# Patient Record
Sex: Male | Born: 1985 | Race: Black or African American | Hispanic: No | Marital: Married | Smoking: Current every day smoker
Health system: Southern US, Community
[De-identification: ages and names within clinical notes are randomized; demographics above are authoritative.]

---
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
## 2008-12-21 ENCOUNTER — Emergency Department (HOSPITAL_COMMUNITY): Admission: EM | Admit: 2008-12-21 | Discharge: 2008-12-22 | Payer: Self-pay | Admitting: Emergency Medicine

## 2009-05-13 ENCOUNTER — Emergency Department (HOSPITAL_COMMUNITY): Admission: EM | Admit: 2009-05-13 | Discharge: 2009-05-13 | Payer: Self-pay | Admitting: Emergency Medicine

## 2010-10-17 ENCOUNTER — Emergency Department (HOSPITAL_COMMUNITY)
Admission: EM | Admit: 2010-10-17 | Discharge: 2010-10-17 | Discharge status: Against Medical Advice | Payer: No Typology Code available for payment source | Attending: Emergency Medicine | Admitting: Emergency Medicine

## 2010-10-17 ENCOUNTER — Emergency Department (HOSPITAL_COMMUNITY): Payer: Self-pay

## 2010-10-17 DIAGNOSIS — M542 Cervicalgia: Secondary | ICD-10-CM | POA: Insufficient documentation

## 2010-10-18 ENCOUNTER — Inpatient Hospital Stay (INDEPENDENT_AMBULATORY_CARE_PROVIDER_SITE_OTHER)
Admission: RE | Admit: 2010-10-18 | Discharge: 2010-10-18 | Disposition: A | Discharge status: Home / Self Care | Payer: Self-pay | Source: Ambulatory Visit | Attending: Family Medicine | Admitting: Family Medicine

## 2010-10-18 DIAGNOSIS — Z0489 Encounter for examination and observation for other specified reasons: Secondary | ICD-10-CM

## 2010-11-06 LAB — POCT I-STAT, CHEM 8
BUN: 13 mg/dL (ref 6–23)
Calcium, Ion: 1.23 mmol/L (ref 1.12–1.32)
Chloride: 106 meq/L (ref 96–112)
Creatinine, Ser: 1 mg/dL (ref 0.4–1.5)
Glucose, Bld: 118 mg/dL — ABNORMAL HIGH (ref 70–99)
HCT: 44 % (ref 39.0–52.0)
Hemoglobin: 15 g/dL (ref 13.0–17.0)
Potassium: 3.6 meq/L (ref 3.5–5.1)
Sodium: 142 meq/L (ref 135–145)
TCO2: 26 mmol/L (ref 0–100)

## 2010-11-06 LAB — CBC
HCT: 40.6 % (ref 39.0–52.0)
Hemoglobin: 13.2 g/dL (ref 13.0–17.0)
MCHC: 32.5 g/dL (ref 30.0–36.0)
MCV: 78.8 fL (ref 78.0–100.0)
Platelets: 210 10*3/uL (ref 150–400)
RBC: 5.15 MIL/uL (ref 4.22–5.81)
RDW: 17.5 % — ABNORMAL HIGH (ref 11.5–15.5)
WBC: 7.4 10*3/uL (ref 4.0–10.5)

## 2010-11-06 LAB — DIFFERENTIAL
Basophils Absolute: 0 K/uL (ref 0.0–0.1)
Basophils Relative: 0 % (ref 0–1)
Eosinophils Absolute: 0.4 K/uL (ref 0.0–0.7)
Eosinophils Relative: 6 % — ABNORMAL HIGH (ref 0–5)
Lymphocytes Relative: 20 % (ref 12–46)
Lymphs Abs: 1.5 K/uL (ref 0.7–4.0)
Monocytes Absolute: 0.3 K/uL (ref 0.1–1.0)
Monocytes Relative: 4 % (ref 3–12)
Neutro Abs: 5.1 K/uL (ref 1.7–7.7)
Neutrophils Relative %: 69 % (ref 43–77)

## 2014-08-27 ENCOUNTER — Encounter (HOSPITAL_COMMUNITY): Payer: Self-pay | Admitting: Emergency Medicine

## 2014-08-27 ENCOUNTER — Emergency Department (HOSPITAL_COMMUNITY)
Admission: EM | Admit: 2014-08-27 | Discharge: 2014-08-27 | Disposition: A | Discharge status: Home / Self Care | Payer: Self-pay | Attending: Emergency Medicine | Admitting: Emergency Medicine

## 2014-08-27 ENCOUNTER — Emergency Department (HOSPITAL_COMMUNITY): Payer: Self-pay

## 2014-08-27 DIAGNOSIS — W228XXA Striking against or struck by other objects, initial encounter: Secondary | ICD-10-CM | POA: Insufficient documentation

## 2014-08-27 DIAGNOSIS — Z720 Tobacco use: Secondary | ICD-10-CM | POA: Insufficient documentation

## 2014-08-27 DIAGNOSIS — Y998 Other external cause status: Secondary | ICD-10-CM | POA: Insufficient documentation

## 2014-08-27 DIAGNOSIS — Y9389 Activity, other specified: Secondary | ICD-10-CM | POA: Insufficient documentation

## 2014-08-27 DIAGNOSIS — Y9289 Other specified places as the place of occurrence of the external cause: Secondary | ICD-10-CM | POA: Insufficient documentation

## 2014-08-27 DIAGNOSIS — S60221A Contusion of right hand, initial encounter: Secondary | ICD-10-CM | POA: Insufficient documentation

## 2014-08-27 NOTE — ED Notes (Addendum)
Pt reports punching a wall last night and injuring his right wrist yesterday. Hand is noticeably swollen. No obvious deformities. Took Ibuprofen this morning with no alleviation of symptoms. No other c/c. No open lacerations or abrasions.

## 2014-08-27 NOTE — Discharge Instructions (Signed)
Contusion °A contusion is a deep bruise. Contusions are the result of an injury that caused bleeding under the skin. The contusion may turn blue, purple, or yellow. Minor injuries will give you a painless contusion, but more severe contusions may stay painful and swollen for a few weeks.  °CAUSES  °A contusion is usually caused by a blow, trauma, or direct force to an area of the body. °SYMPTOMS  °· Swelling and redness of the injured area. °· Bruising of the injured area. °· Tenderness and soreness of the injured area. °· Pain. °DIAGNOSIS  °The diagnosis can be made by taking a history and physical exam. An X-ray, CT scan, or MRI may be needed to determine if there were any associated injuries, such as fractures. °TREATMENT  °Specific treatment will depend on what area of the body was injured. In general, the best treatment for a contusion is resting, icing, elevating, and applying cold compresses to the injured area. Over-the-counter medicines may also be recommended for pain control. Ask your caregiver what the best treatment is for your contusion. °HOME CARE INSTRUCTIONS  °· Put ice on the injured area. °¨ Put ice in a plastic bag. °¨ Place a towel between your skin and the bag. °¨ Leave the ice on for 15-20 minutes, 3-4 times a day, or as directed by your health care provider. °· Only take over-the-counter or prescription medicines for pain, discomfort, or fever as directed by your caregiver. Your caregiver may recommend avoiding anti-inflammatory medicines (aspirin, ibuprofen, and naproxen) for 48 hours because these medicines may increase bruising. °· Rest the injured area. °· If possible, elevate the injured area to reduce swelling. °SEEK IMMEDIATE MEDICAL CARE IF:  °· You have increased bruising or swelling. °· You have pain that is getting worse. °· Your swelling or pain is not relieved with medicines. °MAKE SURE YOU:  °· Understand these instructions. °· Will watch your condition. °· Will get help right  away if you are not doing well or get worse. °Document Released: 04/24/2005 Document Revised: 07/20/2013 Document Reviewed: 05/20/2011 °[employer]® Patient Information ©2015 [employer]. This information is not intended to replace advice given to you by your health care provider. Make sure you discuss any questions you have with your health care provider. ° °

## 2014-08-27 NOTE — ED Provider Notes (Signed)
CSN: [id number]     Arrival date & time 08/27/14  1501 History  This chart was scribed for non-physician practitioner, Langston MaskerKaren Ilisa Hayworth, PA-C,working with Toy BakerAnthony T Allen, MD, by Karle PlumberJennifer Tensley, ED Scribe. This patient was seen in room WTR8/WTR8 and the patient's care was started at 4:37 PM.  Chief Complaint  Patient presents with  . Wrist Injury   Patient is a 29 y.o. male presenting with wrist injury. The history is provided by the patient and medical records. No language interpreter was used.  Wrist Injury Associated symptoms: no fever     HPI Comments:  Nicholas Walker is a 29 y.o. male who presents to the Emergency Department complaining of severe right wrist pain that began last night after punching a wall. He reports associated swelling of the hand as well. He states he took some Ibuprofen earlier this morning with no significant relief of the pain. Moving or touching the area makes the pain worse. Denies alleviating factors. Denies bruising, wounds, fever, chills, nausea or vomiting.  History reviewed. No pertinent past medical history. History reviewed. No pertinent past surgical history. History reviewed. No pertinent family history. History  Substance Use Topics  . Smoking status: Current Every Day Smoker  . Smokeless tobacco: Not on file  . Alcohol Use: Yes    Review of Systems  Constitutional: Negative for fever and chills.  Gastrointestinal: Negative for nausea and vomiting.  Musculoskeletal: Positive for joint swelling and arthralgias.  Skin: Negative for color change and wound.  All other systems reviewed and are negative.     Allergies  Review of patient's allergies indicates no known allergies.  Home Medications   Prior to Admission medications   Medication Sig Start Date End Date Taking? Authorizing Provider  ibuprofen (ADVIL,MOTRIN) 200 MG tablet Take 400 mg by mouth every 6 (six) hours as needed for moderate pain (wrist pain).   Yes Historical Provider, MD    Triage Vitals: BP 125/63 mmHg  Pulse 70  Temp(Src) 97.3 F (36.3 C) (Oral)  Resp 17  SpO2 99% Physical Exam  Constitutional: He is oriented to person, place, and time. He appears well-developed and well-nourished.  HENT:  Head: Normocephalic and atraumatic.  Eyes: EOM are normal.  Neck: Normal range of motion.  Cardiovascular: Normal rate.   Pulmonary/Chest: Effort normal.  Musculoskeletal: Normal range of motion. He exhibits tenderness. He exhibits no edema.  Diffusely swollen right hand. Diffusely tender to 2nd, 3rd and 4th metacarpal heads. No deformity.  Neurological: He is alert and oriented to person, place, and time.  Skin: Skin is warm and dry.  Psychiatric: He has a normal mood and affect. His behavior is normal.  Nursing note and vitals reviewed.   ED Course  Procedures (including critical care time) DIAGNOSTIC STUDIES: Oxygen Saturation is 99% on RA, normal by my interpretation.   COORDINATION OF CARE: 4:40 PM- Informed pt that X-Ray was negative and offered pain medication but pt declined. Pt verbalizes understanding and agrees to plan.  Medications - No data to display  Labs Review Labs Reviewed - No data to display  Imaging Review Dg Wrist Complete Right  08/27/2014   CLINICAL DATA:  Pain after punching wall 1 day prior  EXAM: RIGHT WRIST - COMPLETE 3+ VIEW  COMPARISON:  None.  FINDINGS: Frontal, oblique, lateral, and ulnar deviation scaphoid images were obtained. No fracture or dislocation. Joint spaces appear intact. No erosive change.  IMPRESSION: No fracture or dislocation.  No appreciable arthropathy.   Electronically Signed  By: Bretta Bang M.D.   On: 08/27/2014 16:28   Dg Hand Complete Right  08/27/2014   CLINICAL DATA:  The patient punched a wall 08/26/2014. Left hand and wrist pain. Initial encounter.  EXAM: RIGHT HAND - COMPLETE 3+ VIEW  COMPARISON:  None.  FINDINGS: Imaged bones, joints and soft tissues appear normal.  IMPRESSION: Negative  exam.   Electronically Signed   By: Drusilla Kanner M.D.   On: 08/27/2014 16:27     EKG Interpretation None      MDM   Final diagnoses:  Contusion, hand, right, initial encounter      I personally performed the services described in this documentation, which was scribed in my presence. The recorded information has been reviewed and is accurate.    Lonia Skinner [place], PA-C 08/27/14 2019  Toy Baker, MD 08/28/14 [phone]

## 2015-11-22 IMAGING — CR DG WRIST COMPLETE 3+V*R*
4 series · 4 of 4 positions shown · non-contrast
Comparison: None.

CLINICAL DATA: Pain after punching wall 1 day prior

EXAM:
RIGHT WRIST - COMPLETE 3+ VIEW

[x wrist pa right]
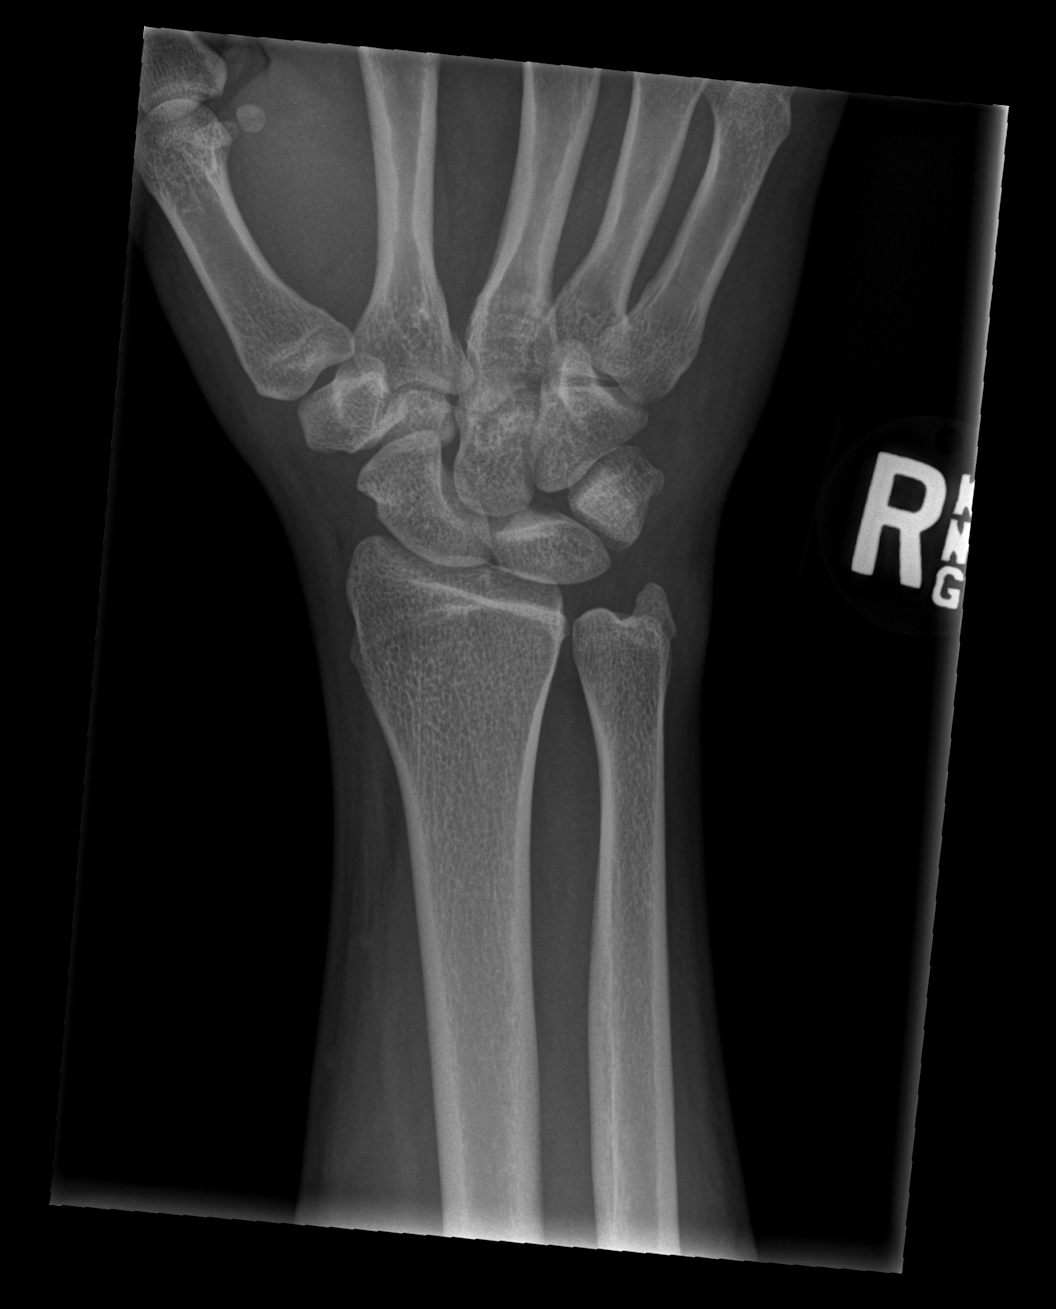

[x wrist obl right]
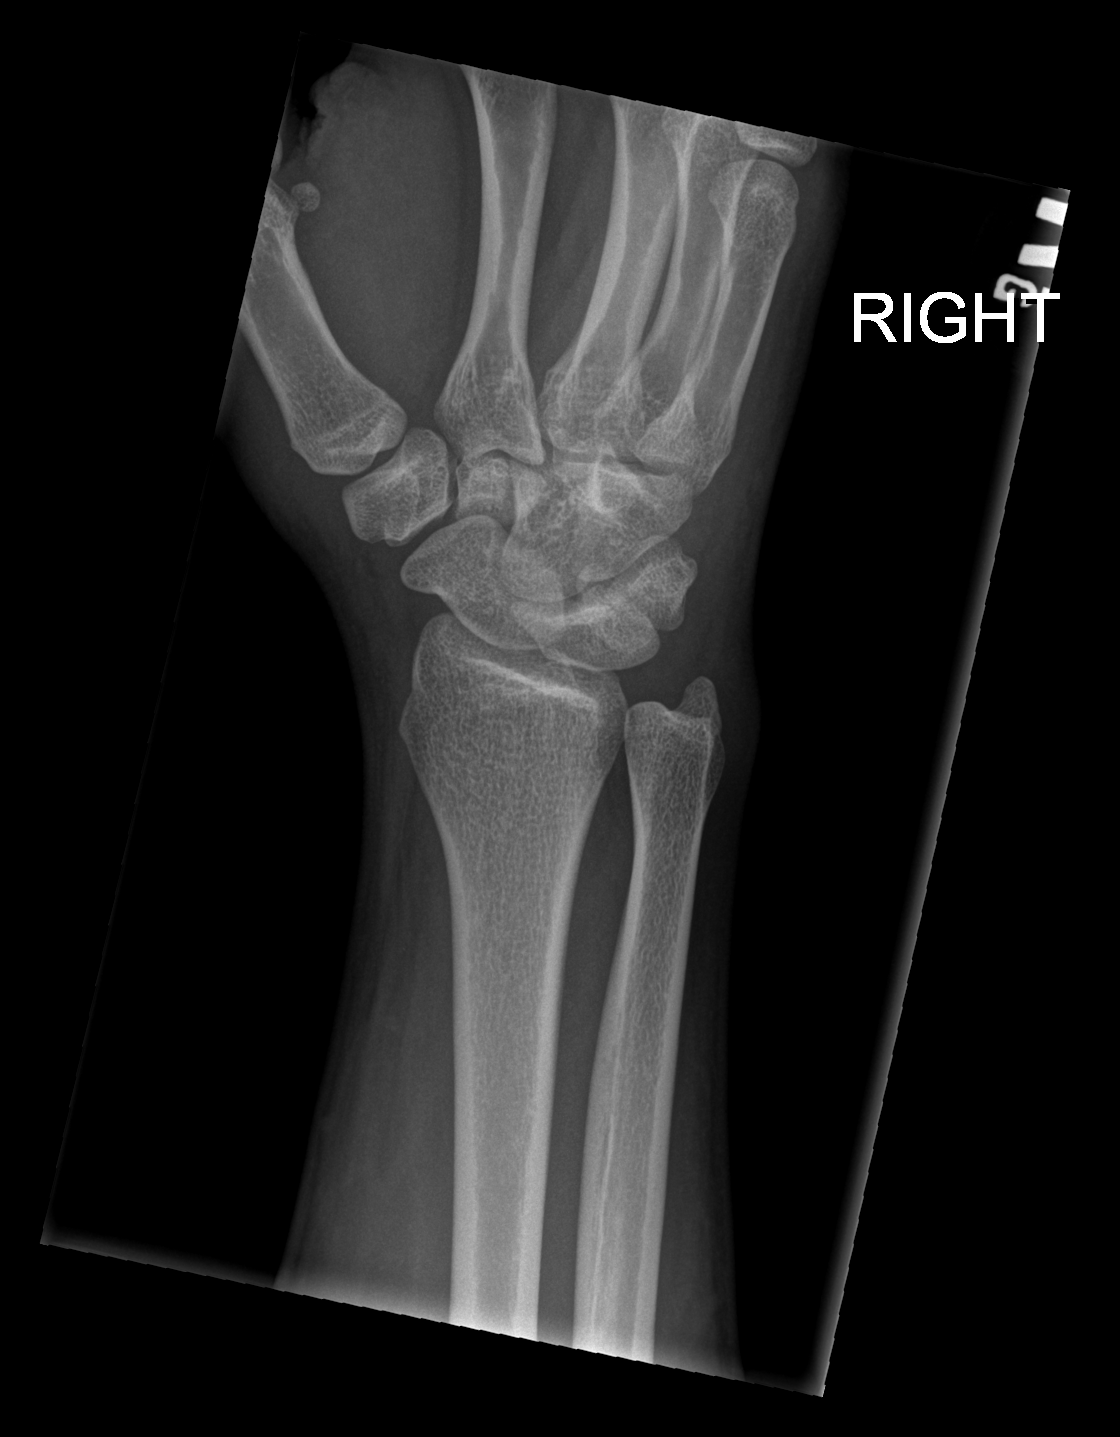

[x wrist lat right]
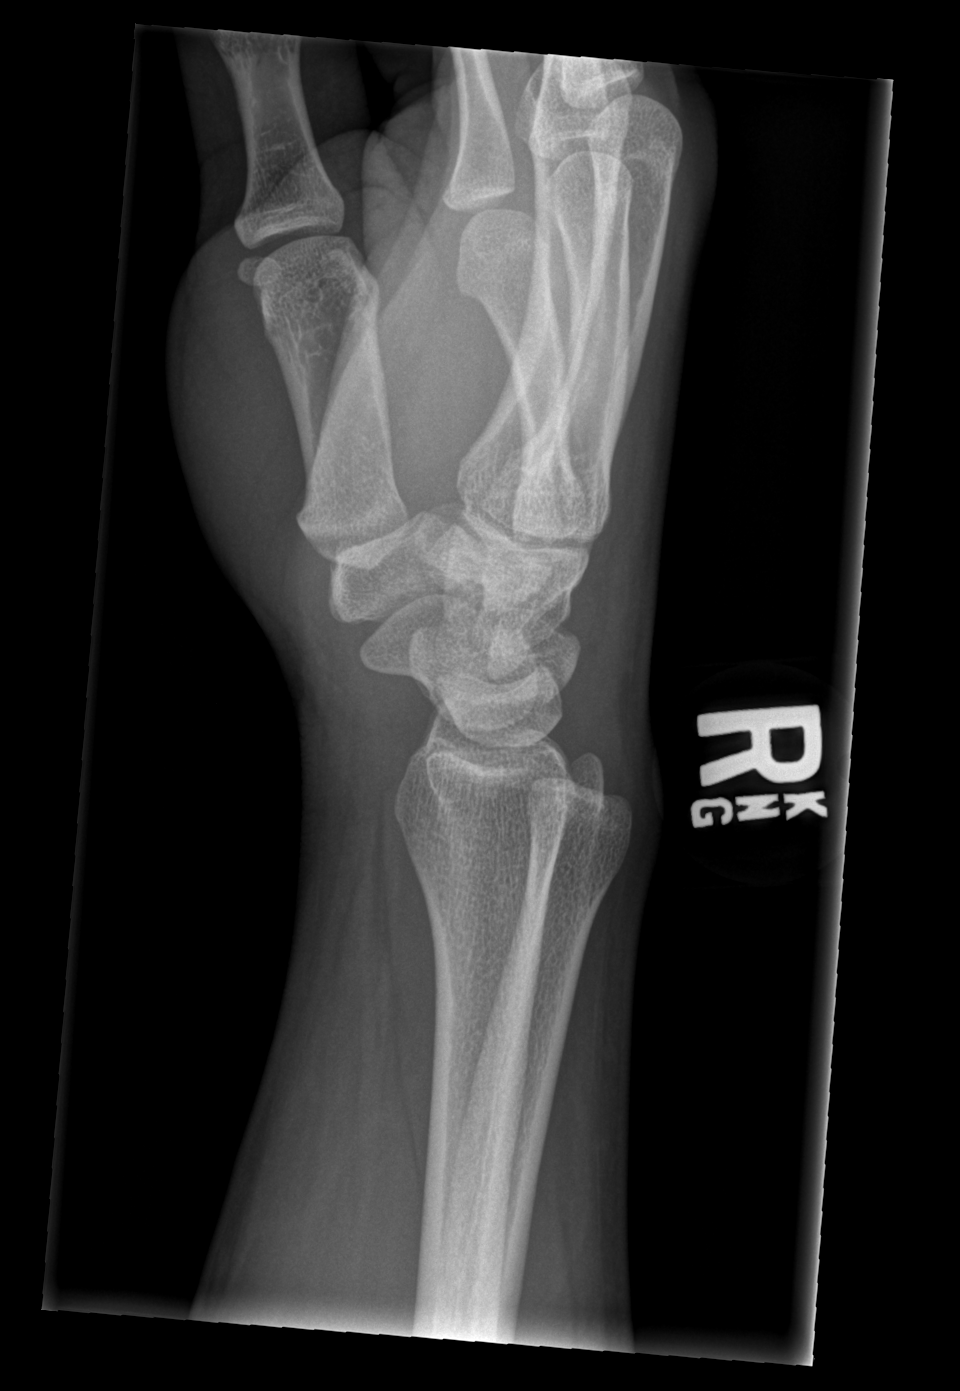

[x wrist navicular view right]
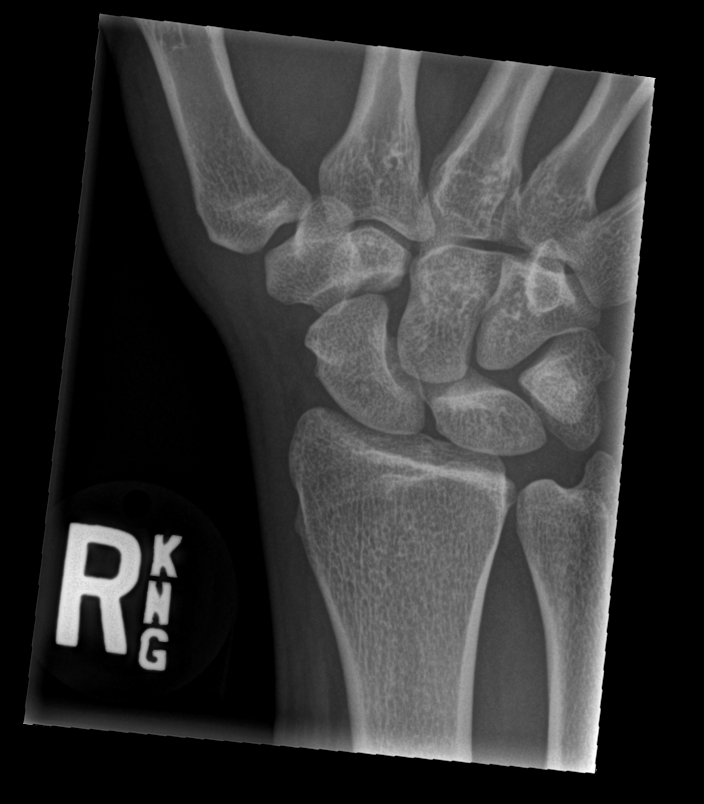

[4 of 4 positions shown; findings below may reference images not displayed]

FINDINGS: Frontal, oblique, lateral, and ulnar deviation scaphoid images were
obtained. No fracture or dislocation. Joint spaces appear intact. No
erosive change.
IMPRESSION: No fracture or dislocation.  No appreciable arthropathy.

## 2016-09-24 ENCOUNTER — Ambulatory Visit: Payer: Self-pay | Admitting: Family Medicine
